# Patient Record
Sex: Female | Born: 1985 | Race: Black or African American | Hispanic: No | Marital: Single | Smoking: Current every day smoker
Health system: Southern US, Community
[De-identification: ages and names within clinical notes are randomized; demographics above are authoritative.]

## PROBLEM LIST (undated history)

## (undated) HISTORY — PX: BREAST BIOPSY: SHX20

---

## 2007-08-10 ENCOUNTER — Inpatient Hospital Stay (HOSPITAL_COMMUNITY): Admission: AD | Admit: 2007-08-10 | Discharge: 2007-08-10 | Payer: Self-pay | Admitting: Obstetrics and Gynecology

## 2007-08-10 ENCOUNTER — Ambulatory Visit: Payer: Self-pay | Admitting: Obstetrics and Gynecology

## 2007-08-14 ENCOUNTER — Inpatient Hospital Stay (HOSPITAL_COMMUNITY): Admission: AD | Admit: 2007-08-14 | Discharge: 2007-08-14 | Payer: Self-pay | Admitting: Obstetrics and Gynecology

## 2007-08-14 ENCOUNTER — Encounter: Payer: Self-pay | Admitting: *Deleted

## 2007-08-14 ENCOUNTER — Ambulatory Visit (HOSPITAL_COMMUNITY): Admission: RE | Admit: 2007-08-14 | Discharge: 2007-08-14 | Payer: Self-pay | Admitting: Obstetrics and Gynecology

## 2007-08-16 ENCOUNTER — Ambulatory Visit: Payer: Self-pay | Admitting: *Deleted

## 2007-08-21 ENCOUNTER — Ambulatory Visit: Payer: Self-pay | Admitting: Family Medicine

## 2007-08-24 ENCOUNTER — Ambulatory Visit: Payer: Self-pay | Admitting: Gynecology

## 2007-08-31 ENCOUNTER — Ambulatory Visit (HOSPITAL_COMMUNITY): Admission: RE | Admit: 2007-08-31 | Discharge: 2007-08-31 | Payer: Self-pay | Admitting: Family Medicine

## 2007-09-07 ENCOUNTER — Ambulatory Visit: Payer: Self-pay | Admitting: Obstetrics and Gynecology

## 2007-09-11 ENCOUNTER — Ambulatory Visit: Payer: Self-pay | Admitting: Family Medicine

## 2007-09-14 ENCOUNTER — Ambulatory Visit (HOSPITAL_COMMUNITY): Admission: RE | Admit: 2007-09-14 | Discharge: 2007-09-14 | Payer: Self-pay | Admitting: Family Medicine

## 2007-09-18 ENCOUNTER — Inpatient Hospital Stay (HOSPITAL_COMMUNITY): Admission: RE | Admit: 2007-09-18 | Discharge: 2007-09-23 | Payer: Self-pay | Admitting: Family Medicine

## 2007-09-18 ENCOUNTER — Ambulatory Visit: Payer: Self-pay | Admitting: Obstetrics & Gynecology

## 2007-09-18 ENCOUNTER — Ambulatory Visit: Payer: Self-pay | Admitting: Obstetrics and Gynecology

## 2007-09-19 ENCOUNTER — Encounter: Payer: Self-pay | Admitting: *Deleted

## 2007-09-20 ENCOUNTER — Encounter: Payer: Self-pay | Admitting: *Deleted

## 2007-09-21 ENCOUNTER — Encounter: Payer: Self-pay | Admitting: Obstetrics and Gynecology

## 2007-09-26 ENCOUNTER — Ambulatory Visit: Payer: Self-pay | Admitting: Family Medicine

## 2008-01-30 IMAGING — US US OB FOLLOW-UP
1 series · 14 of 25 positions shown · non-contrast
Comparison: none

OBSTETRICAL ULTRASOUND:
 This ultrasound was performed in The [HOSPITAL], and the [facility] report will be stored to [REDACTED] PACS.

[Series 1: us ob follow-up · 14 of 25 slices shown]
[im 1/25]
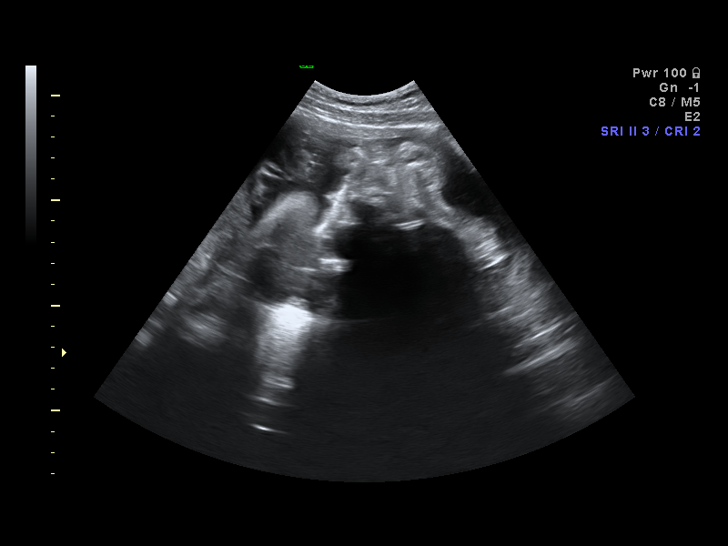
[im 3/25]
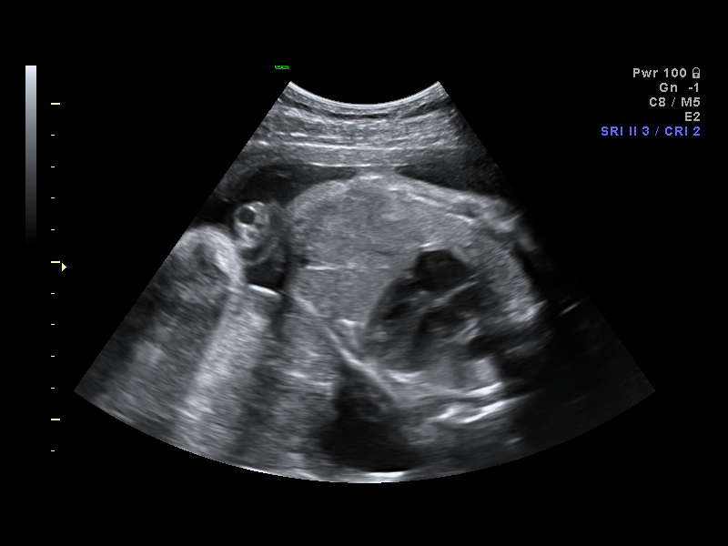
[im 5/25]
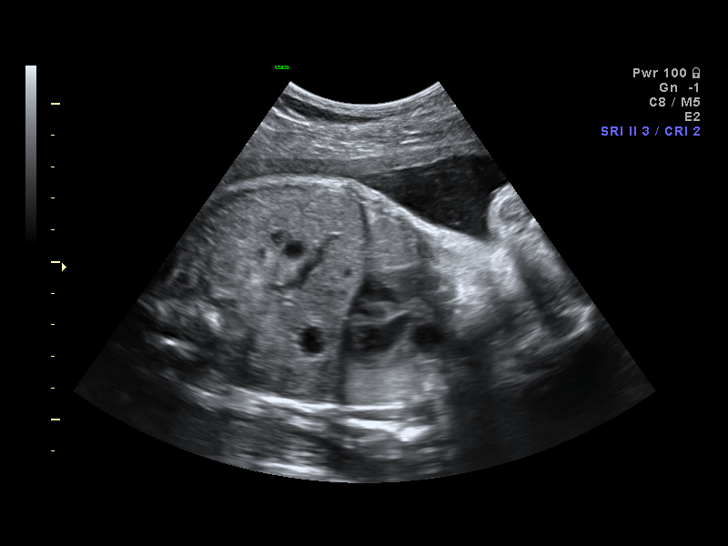
[im 7/25]
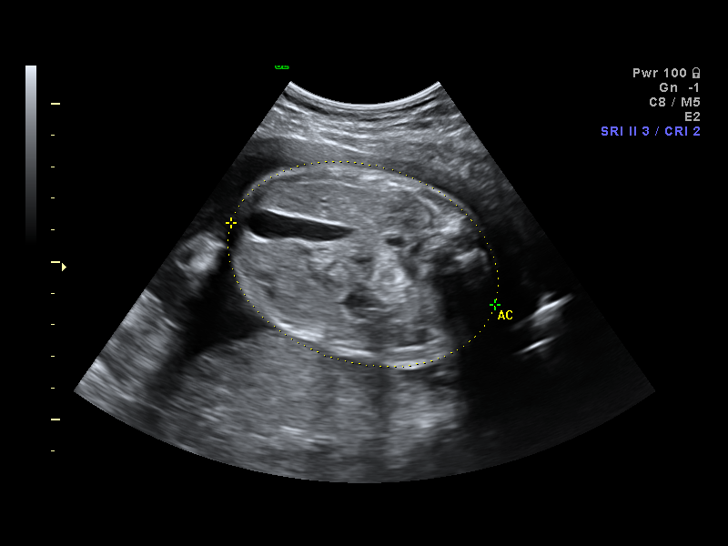
[im 9/25]
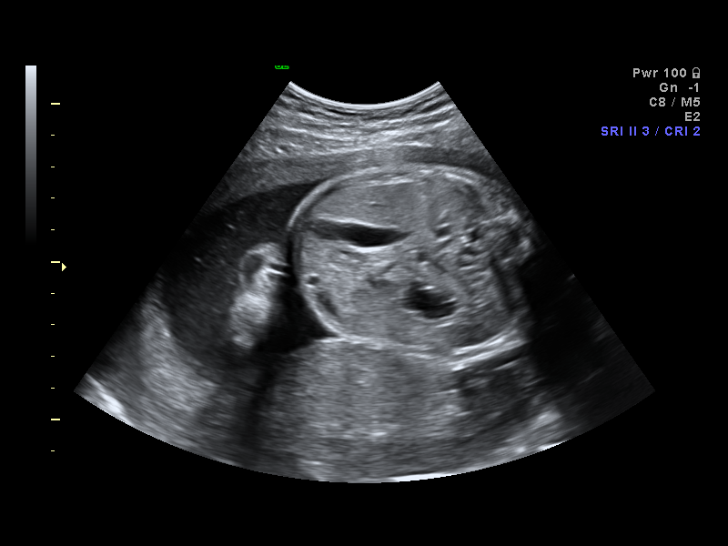
[im 10/25]
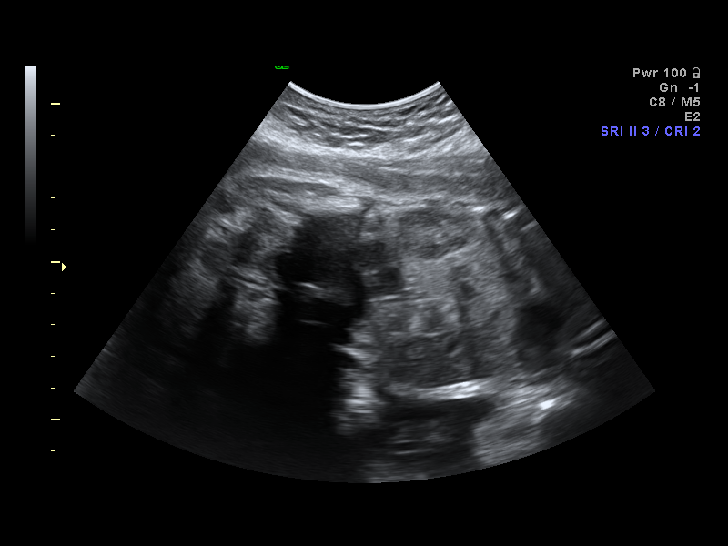
[im 12/25]
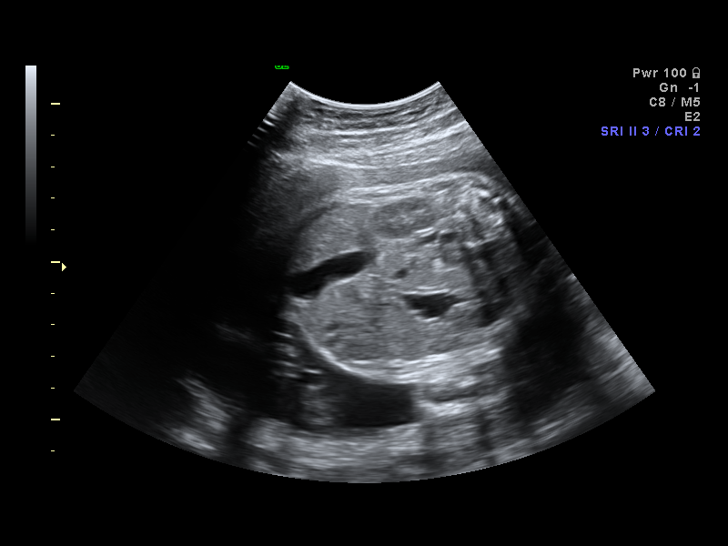
[im 14/25]
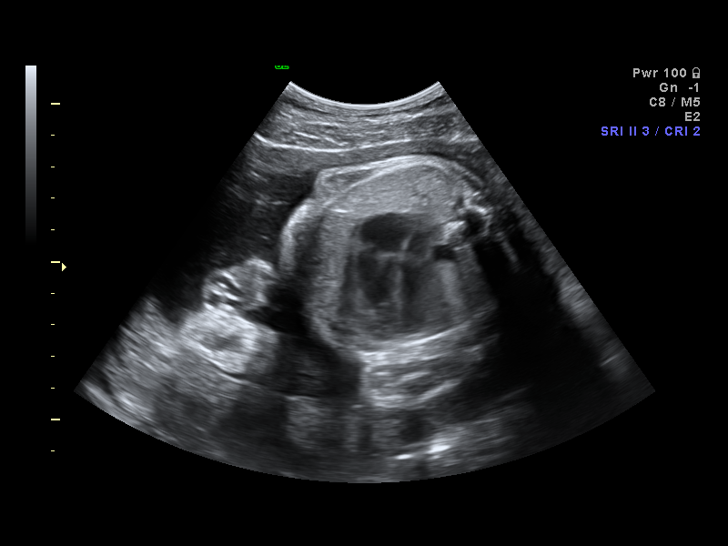
[im 16/25]
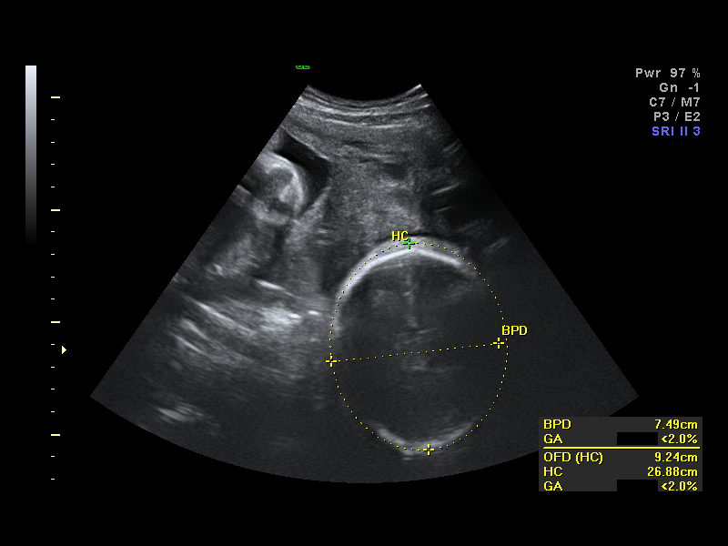
[im 17/25]
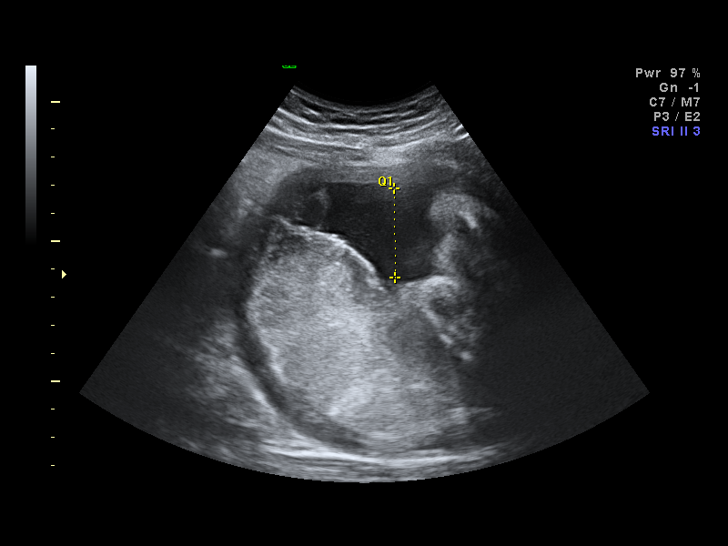
[im 19/25]
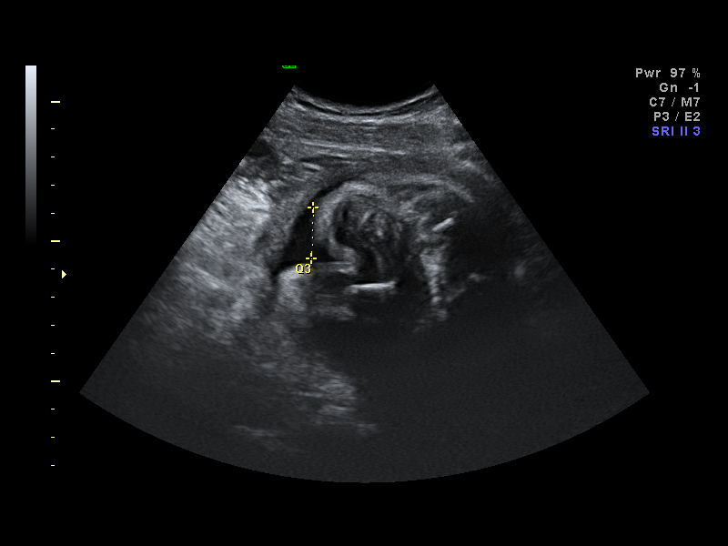
[im 21/25]
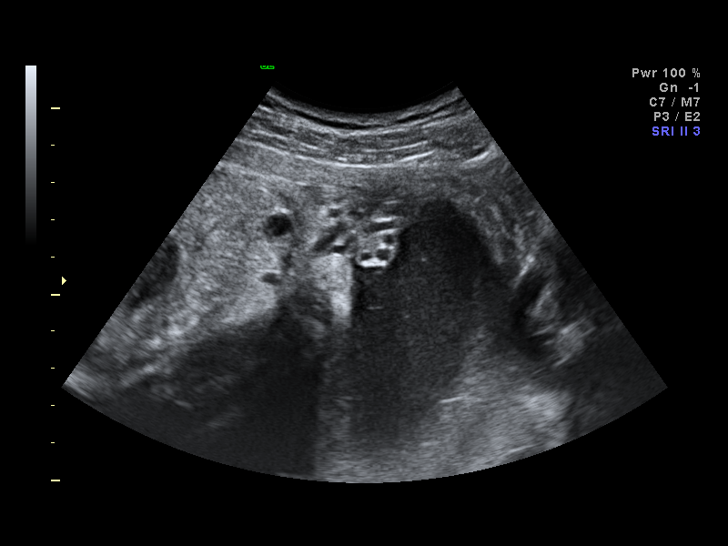
[im 23/25]
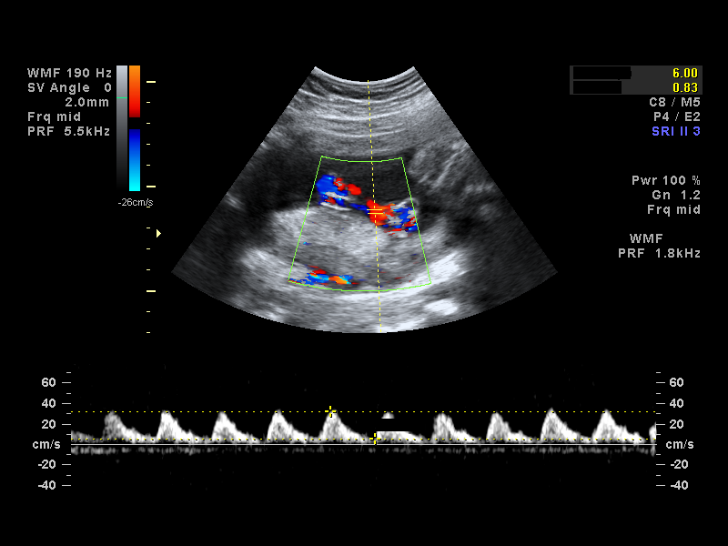
[im 25/25]
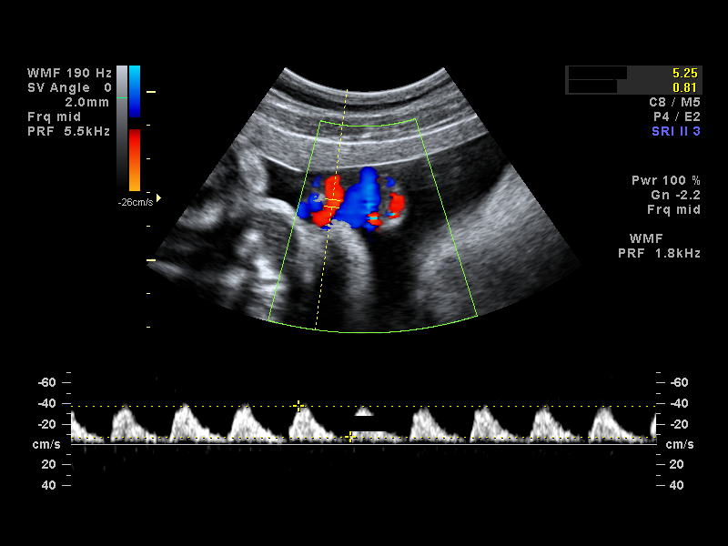

[14 of 25 positions shown; findings below may reference images not displayed]

IMPRESSION: The [facility] report has also been faxed to the ordering physician.

## 2010-12-27 ENCOUNTER — Encounter: Payer: Self-pay | Admitting: *Deleted

## 2010-12-28 ENCOUNTER — Encounter: Payer: Self-pay | Admitting: *Deleted

## 2011-04-20 NOTE — Op Note (Signed)
NAME:  Hamilton, Stacey              ACCOUNT NO.:  [id number]   MEDICAL RECORD NO.:  [id number]          PATIENT TYPE:  INP   LOCATION:                                FACILITY:  [facility]   PHYSICIAN:  John V. Ferguson, M.D. DATE OF BIRTH:  07/04/1986   DATE OF PROCEDURE:  DATE OF DISCHARGE:  09/26/2007                               OPERATIVE REPORT   PREOP DIAGNOSIS:  Pregnancy [redacted] weeks gestation, nonreassuring fetal  status.   POSTOP DIAGNOSIS:  Pregnancy [redacted] weeks gestation, nonreassuring fetal  status, delivered.   PROCEDURE:  Primary low transverse cervical cesarean section.   SURGEON:  1. Ferguson.  2. Simons.   ANESTHESIA:  Spinal.   COMPLICATIONS:  None.   EBL:  400 mL.   INDICATIONS:  A 25-year-old female being followed with uncertain fetal  status, noted to have worsening of the nonreassuring variable  decelerations.  Beat-to-beat variability remained present.  There was no  active labor but decelerations were spontaneously occurring.   DETAILS OF PROCEDURE:  The patient was taken to the operating room,  prepped and draped for lower abdominal surgery with a Foley catheter in  place.  A Pfannenstiel type incision was performed with nice development  of tissue planes and blunt entry of the peritoneal cavity.  A bladder  flap was developed on the low uterine segment, extended laterally using  blunt dissection and a transverse nick made in the lower uterine  segment, extended laterally and upward in order to give access to the  baby.  The amniotic fluid was clear with some vernix present.  The fetal  vertex was rotated into the incision and delivered with fundal pressure  with ease.  The infant was placed in the care of Dr. Dimaguila, see her  notes for details.  Apgars of 8/8 were assigned, weight was 13__ grams.  Placenta was noted to be quite small.  The uterus itself was actually  amazingly small.  The cord blood samples were obtained including a blood  gas which showed  a pH of 7.28 on arterial sample.  This was obtained  close to the placenta due to the small size of the cord and difficulty  obtaining the sample.  Venous blood samples were obtained for routine  labs on the infant, placenta went to Pathology.   The uterus was emptied.  The uterus closed using single layer running  locking #0 chromic and with good hemostasis.  Bladder flap was loosely  reapproximated with a single stitch of #2-0 chromic.  The peritoneal  cavity was irrigated, then the anterior peritoneum closed with  continuous running #2-0 chromic.  The rectus muscles were  pulled together with a single suture of #0 Vicryl.  The fascia was  closed in a continuous running fashion using #0 Vicryl.  The subcu  tissues were reapproximated using interrupted #2-0 chromic x3 sutures.  Staple closure of the skin completed the procedure with good tissue edge  approximation.  Sponge and needle counts correct.      John V. Ferguson, M.D.  Electronically Signed     JVF/MEDQ  D:    09/21/2007  T:  09/21/2007  Job:  [id number] 
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
## 2011-04-20 NOTE — Op Note (Signed)
[name], [name]              ACCOUNT NO.:  [id number]   MEDICAL RECORD NO.:  [id number]          PATIENT TYPE:  INP   LOCATION:                                [facility]ILITY:  [facility]   PHYSICIAN:  Tilda Burrow, M.D. DATE OF BIRTH:  [birth year]   DATE OF PROCEDURE:  DATE OF DISCHARGE:  09/26/2007                               OPERATIVE REPORT   PREOP DIAGNOSIS:  Pregnancy [redacted] weeks gestation, nonreassuring fetal  status.   POSTOP DIAGNOSIS:  Pregnancy [redacted] weeks gestation, nonreassuring fetal  status, delivered.   PROCEDURE:  Primary low transverse cervical cesarean section.   SURGEON:  1. Ferguson.  2. Darrol Angel.   ANESTHESIA:  Spinal.   COMPLICATIONS:  None.   EBL:  400 mL.   INDICATIONS:  A 25 year old female being followed with uncertain fetal  status, noted to have worsening of the nonreassuring variable  decelerations.  Beat-to-beat variability remained present.  There was no  active labor but decelerations were spontaneously occurring.   DETAILS OF PROCEDURE:  The patient was taken to the operating room,  prepped and draped for lower abdominal surgery with a Foley catheter in  place.  A Pfannenstiel type incision was performed with nice development  of tissue planes and blunt entry of the peritoneal cavity.  A bladder  flap was developed on the low uterine segment, extended laterally using  blunt dissection and a transverse nick made in the lower uterine  segment, extended laterally and upward in order to give access to the  baby.  The amniotic fluid was clear with some vernix present.  The fetal  vertex was rotated into the incision and delivered with fundal pressure  with ease.  The infant was placed in the care of Dr. Francine Graven, see her  notes for details.  Apgars of 8/8 were assigned, weight was 13__ grams.  Placenta was noted to be quite small.  The uterus itself was actually  amazingly small.  The cord blood samples were obtained including a blood  gas which showed  a pH of 7.28 on arterial sample.  This was obtained  close to the placenta due to the small size of the cord and difficulty  obtaining the sample.  Venous blood samples were obtained for routine  labs on the infant, placenta went to Pathology.   The uterus was emptied.  The uterus closed using single layer running  locking #0 chromic and with good hemostasis.  Bladder flap was loosely  reapproximated with a single stitch of #2-0 chromic.  The peritoneal  cavity was irrigated, then the anterior peritoneum closed with  continuous running #2-0 chromic.  The rectus muscles were  pulled together with a single suture of #0 Vicryl.  The fascia was  closed in a continuous running fashion using #0 Vicryl.  The subcu  tissues were reapproximated using interrupted #2-0 chromic x3 sutures.  Staple closure of the skin completed the procedure with good tissue edge  approximation.  Sponge and needle counts correct.      Tilda Burrow, M.D.  Electronically Signed     JVF/MEDQ  D:  09/21/2007  T:  09/21/2007  Job:  [id number]

## 2011-09-15 LAB — CBC: WBC: 8.8

## 2011-09-16 LAB — CBC
Hemoglobin: 12.7
MCV: 89.1
RBC: 4.22

## 2011-09-16 LAB — RAPID URINE DRUG SCREEN, HOSP PERFORMED
Amphetamines: NOT DETECTED
Barbiturates: NOT DETECTED
Benzodiazepines: NOT DETECTED
Cocaine: NOT DETECTED
Opiates: NOT DETECTED
Tetrahydrocannabinol: NOT DETECTED

## 2011-09-16 LAB — POCT URINALYSIS DIP (DEVICE)
Bilirubin Urine: NEGATIVE
Bilirubin Urine: NEGATIVE
Glucose, UA: NEGATIVE
Glucose, UA: NEGATIVE
Hgb urine dipstick: NEGATIVE
Operator id: 120861
Protein, ur: NEGATIVE
Specific Gravity, Urine: 1.015
Urobilinogen, UA: 0.2
Urobilinogen, UA: 1

## 2011-09-16 LAB — STREP B DNA PROBE: Strep Group B Ag: NEGATIVE

## 2011-09-16 LAB — RPR: RPR Ser Ql: NONREACTIVE

## 2011-09-17 LAB — CBC
HCT: 37.2
Platelets: 278
WBC: 8.2

## 2011-09-17 LAB — URINALYSIS, ROUTINE W REFLEX MICROSCOPIC
Ketones, ur: 15 — AB
Nitrite: NEGATIVE
Specific Gravity, Urine: 1.02
Urobilinogen, UA: 0.2

## 2011-09-17 LAB — DIFFERENTIAL
Eosinophils Relative: 3
Lymphocytes Relative: 28
Lymphs Abs: 2.3
Neutro Abs: 5.1

## 2011-09-17 LAB — TYPE AND SCREEN
ABO/RH(D): B POS
Antibody Screen: NEGATIVE

## 2011-09-17 LAB — RUBELLA SCREEN: Rubella: 68.9 — ABNORMAL HIGH

## 2011-09-17 LAB — RAPID URINE DRUG SCREEN, HOSP PERFORMED
Cocaine: NOT DETECTED
Tetrahydrocannabinol: POSITIVE — AB

## 2011-09-17 LAB — HEPATITIS B SURFACE ANTIGEN: Hepatitis B Surface Ag: NEGATIVE

## 2011-09-17 LAB — GC/CHLAMYDIA PROBE AMP, GENITAL
Chlamydia, DNA Probe: NEGATIVE
GC Probe Amp, Genital: NEGATIVE

## 2011-09-17 LAB — WET PREP, GENITAL: Clue Cells Wet Prep HPF POC: NONE SEEN

## 2011-09-17 LAB — ABO/RH: ABO/RH(D): B POS

## 2011-09-17 LAB — RAPID HIV SCREEN (WH-MAU): Rapid HIV Screen: NONREACTIVE

## 2011-09-17 LAB — SICKLE CELL SCREEN: Sickle Cell Screen: NEGATIVE

## 2011-09-17 LAB — RPR: RPR Ser Ql: NONREACTIVE

## 2012-01-14 ENCOUNTER — Emergency Department (HOSPITAL_COMMUNITY)
Admission: EM | Admit: 2012-01-14 | Discharge: 2012-01-14 | Disposition: A | Discharge status: Home / Self Care | Payer: No Typology Code available for payment source | Attending: Emergency Medicine | Admitting: Emergency Medicine

## 2012-01-14 ENCOUNTER — Encounter (HOSPITAL_COMMUNITY): Payer: Self-pay | Admitting: Emergency Medicine

## 2012-01-14 DIAGNOSIS — S39012A Strain of muscle, fascia and tendon of lower back, initial encounter: Secondary | ICD-10-CM

## 2012-01-14 DIAGNOSIS — S339XXA Sprain of unspecified parts of lumbar spine and pelvis, initial encounter: Secondary | ICD-10-CM | POA: Insufficient documentation

## 2012-01-14 DIAGNOSIS — M545 Low back pain, unspecified: Secondary | ICD-10-CM | POA: Insufficient documentation

## 2012-01-14 MED ORDER — CYCLOBENZAPRINE HCL 5 MG PO TABS: 5.0000 mg | ORAL_TABLET | Freq: Three times a day (TID) | ORAL | Status: AC | PRN

## 2012-01-14 MED ORDER — IBUPROFEN 100 MG/5ML PO SUSP
600.0000 mg | Freq: Once | ORAL | Status: AC
  Administered 2012-01-14: 600 mg via ORAL
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
## 2014-06-03 ENCOUNTER — Emergency Department (HOSPITAL_COMMUNITY)
Admission: EM | Admit: 2014-06-03 | Discharge: 2014-06-03 | Disposition: A | Discharge status: Home / Self Care | Payer: No Typology Code available for payment source | Attending: Emergency Medicine | Admitting: Emergency Medicine

## 2014-06-03 ENCOUNTER — Emergency Department (HOSPITAL_COMMUNITY): Payer: No Typology Code available for payment source

## 2014-06-03 ENCOUNTER — Encounter (HOSPITAL_COMMUNITY): Payer: Self-pay | Admitting: Emergency Medicine

## 2014-06-03 DIAGNOSIS — Y92410 Unspecified street and highway as the place of occurrence of the external cause: Secondary | ICD-10-CM | POA: Insufficient documentation

## 2014-06-03 DIAGNOSIS — S139XXA Sprain of joints and ligaments of unspecified parts of neck, initial encounter: Secondary | ICD-10-CM | POA: Insufficient documentation

## 2014-06-03 DIAGNOSIS — Z3202 Encounter for pregnancy test, result negative: Secondary | ICD-10-CM | POA: Insufficient documentation

## 2014-06-03 DIAGNOSIS — Y9389 Activity, other specified: Secondary | ICD-10-CM | POA: Insufficient documentation

## 2014-06-03 DIAGNOSIS — S161XXA Strain of muscle, fascia and tendon at neck level, initial encounter: Secondary | ICD-10-CM

## 2014-06-03 DIAGNOSIS — IMO0002 Reserved for concepts with insufficient information to code with codable children: Secondary | ICD-10-CM | POA: Insufficient documentation

## 2014-06-03 DIAGNOSIS — M791 Myalgia, unspecified site: Secondary | ICD-10-CM

## 2014-06-03 DIAGNOSIS — F17200 Nicotine dependence, unspecified, uncomplicated: Secondary | ICD-10-CM | POA: Insufficient documentation

## 2014-06-03 LAB — POC URINE PREG, ED: PREG TEST UR: NEGATIVE

## 2014-06-03 MED ORDER — CYCLOBENZAPRINE HCL 10 MG PO TABS: 10.0000 mg | ORAL_TABLET | Freq: Two times a day (BID) | ORAL | Status: AC | PRN

## 2014-06-03 MED ORDER — IBUPROFEN 600 MG PO TABS: 600.0000 mg | ORAL_TABLET | Freq: Four times a day (QID) | ORAL | Status: AC | PRN
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
## 2014-06-03 NOTE — Discharge Instructions (Signed)
Please call your doctor for a followup appointment within 24-48 hours. When you talk to your doctor please let them know that you were seen in the emergency department and have them acquire all of your records so that they can discuss the findings with you and formulate a treatment plan to fully care for your new and ongoing problems. Please call and set-[facility] an [facility] with [facility]  Please call and set-up an appointment with Orthopedics to be seen and re-assessed Please take medications as prescribed - while on muscle relaxers this can lead to drowsiness, please do not take while driving or operating any heavy machinery Please continue to monitor symptoms closely and if symptoms are to worsen or change (fever greater than 101, chill, chest pain, shortness of breath, difficulty breathing, numbness, tingling, weakness, headache, dizziness, visual changes, sudden of loss vision, nausea, vomiting, stomach pain, neck pain, neck stiffness) please report back to the ED immediately    Cervical Strain and Sprain (Whiplash) with Rehab Cervical strain and sprains are injuries that commonly occur with "whiplash" injuries. Whiplash occurs when the neck is forcefully whipped backward or forward, such as during a motor vehicle accident. The muscles, ligaments, tendons, discs and nerves of the neck are susceptible to injury when this occurs. SYMPTOMS   Pain or stiffness in the front and/or back of neck  Symptoms may present immediately or up to 24 hours after injury.  Dizziness, headache, nausea and vomiting.  Muscle spasm with soreness and stiffness in the neck.  Tenderness and swelling at the injury site. CAUSES  Whiplash injuries often occur during contact sports or motor vehicle accidents.  RISK INCREASES WITH:  Osteoarthritis of the spine.  Situations that make head or neck accidents or trauma more likely.  High-risk sports (football, rugby, wrestling, hockey, auto racing,  gymnastics, diving, contact karate or boxing).  Poor strength and flexibility of the neck.  Previous neck injury.  Poor tackling technique.  Improperly fitted or padded equipment. PREVENTION  Learn and use proper technique (avoid tackling with the head, spearing and head-butting; use proper falling techniques to avoid landing on the head).  Warm up and stretch properly before activity.  Maintain physical fitness:  Strength, flexibility and endurance.  Cardiovascular fitness.  Wear properly fitted and padded protective equipment, such as padded soft collars, for participation in contact sports. PROGNOSIS  Recovery for cervical strain and sprain injuries is dependent on the extent of the injury. These injuries are usually curable in 1 week to 3 months with appropriate treatment.  RELATED COMPLICATIONS   Temporary numbness and weakness may occur if the nerve roots are damaged, and this may persist until the nerve has completely healed.  Chronic pain due to frequent recurrence of symptoms.  Prolonged healing, especially if activity is resumed too soon (before complete recovery). TREATMENT  Treatment initially involves the use of ice and medication to help reduce pain and inflammation. It is also important to perform strengthening and stretching exercises and modify activities that worsen symptoms so the injury does not get worse. These exercises may be performed at home or with a therapist. For patients who experience severe symptoms, a soft padded collar may be recommended to be worn around the neck.  Improving your posture may help reduce symptoms. Posture improvement includes pulling your chin and abdomen in while sitting or standing. If you are sitting, sit in a firm chair with your buttocks against the back of the chair. While sleeping, try replacing your pillow with  a small towel rolled to 2 inches in diameter, or use a cervical pillow or soft cervical collar. Poor sleeping  positions delay healing.  For patients with nerve root damage, which causes numbness or weakness, the use of a cervical traction apparatus may be recommended. Surgery is rarely necessary for these injuries. However, cervical strain and sprains that are present at birth (congenital) may require surgery. MEDICATION   If pain medication is necessary, nonsteroidal anti-inflammatory medications, such as aspirin and ibuprofen, or other minor pain relievers, such as acetaminophen, are often recommended.  Do not take pain medication for 7 days before surgery.  Prescription pain relievers may be given if deemed necessary by your caregiver. Use only as directed and only as much as you need. HEAT AND COLD:   Cold treatment (icing) relieves pain and reduces inflammation. Cold treatment should be applied for 10 to 15 minutes every 2 to 3 hours for inflammation and pain and immediately after any activity that aggravates your symptoms. Use ice packs or an ice massage.  Heat treatment may be used prior to performing the stretching and strengthening activities prescribed by your caregiver, physical therapist, or athletic trainer. Use a heat pack or a warm soak. SEEK MEDICAL CARE IF:   Symptoms get worse or do not improve in 2 weeks despite treatment.  New, unexplained symptoms develop (drugs used in treatment may produce side effects). EXERCISES RANGE OF MOTION (ROM) AND STRETCHING EXERCISES - Cervical Strain and Sprain These exercises may help you when beginning to rehabilitate your injury. In order to successfully resolve your symptoms, you must improve your posture. These exercises are designed to help reduce the forward-head and rounded-shoulder posture which contributes to this condition. Your symptoms may resolve with or without further involvement from your physician, physical therapist or athletic trainer. While completing these exercises, remember:   Restoring tissue flexibility helps normal motion to  return to the joints. This allows healthier, less painful movement and activity.  An effective stretch should be held for at least 20 seconds, although you may need to begin with shorter hold times for comfort.  A stretch should never be painful. You should only feel a gentle lengthening or release in the stretched tissue. STRETCH- Axial Extensors  Lie on your back on the floor. You may bend your knees for comfort. Place a rolled up hand towel or dish towel, about 2 inches in diameter, under the part of your head that makes contact with the floor.  Gently tuck your chin, as if trying to make a "double chin," until you feel a gentle stretch at the base of your head.  Hold __________ seconds. Repeat __________ times. Complete this exercise __________ times per day.  STRETECH - Axial Extension   Stand or sit on a firm surface. Assume a good posture: chest up, shoulders drawn back, abdominal muscles slightly tense, knees unlocked (if standing) and feet hip width apart.  Slowly retract your chin so your head slides back and your chin slightly lowers.Continue to look straight ahead.  You should feel a gentle stretch in the back of your head. Be certain not to feel an aggressive stretch since this can cause headaches later.  Hold for __________ seconds. Repeat __________ times. Complete this exercise __________ times per day. STRETCH - Cervical Side Bend   Stand or sit on a firm surface. Assume a good posture: chest up, shoulders drawn back, abdominal muscles slightly tense, knees unlocked (if standing) and feet hip width apart.  Without letting your  nose or shoulders move, slowly tip your right / left ear to your shoulder until your feel a gentle stretch in the muscles on the opposite side of your neck.  Hold __________ seconds. Repeat __________ times. Complete this exercise __________ times per day. STRETCH - Cervical Rotators   Stand or sit on a firm surface. Assume a good posture:  chest up, shoulders drawn back, abdominal muscles slightly tense, knees unlocked (if standing) and feet hip width apart.  Keeping your eyes level with the ground, slowly turn your head until you feel a gentle stretch along the back and opposite side of your neck.  Hold __________ seconds. Repeat __________ times. Complete this exercise __________ times per day. RANGE OF MOTION - Neck Circles   Stand or sit on a firm surface. Assume a good posture: chest up, shoulders drawn back, abdominal muscles slightly tense, knees unlocked (if standing) and feet hip width apart.  Gently roll your head down and around from the back of one shoulder to the back of the other. The motion should never be forced or painful.  Repeat the motion 10-20 times, or until you feel the neck muscles relax and loosen. Repeat __________ times. Complete the exercise __________ times per day. STRENGTHENING EXERCISES - Cervical Strain and Sprain These exercises may help you when beginning to rehabilitate your injury. They may resolve your symptoms with or without further involvement from your physician, physical therapist or athletic trainer. While completing these exercises, remember:   Muscles can gain both the endurance and the strength needed for everyday activities through controlled exercises.  Complete these exercises as instructed by your physician, physical therapist or athletic trainer. Progress the resistance and repetitions only as guided.  You may experience muscle soreness or fatigue, but the pain or discomfort you are trying to eliminate should never worsen during these exercises. If this pain does worsen, stop and make certain you are following the directions exactly. If the pain is still present after adjustments, discontinue the exercise until you can discuss the trouble with your clinician. STRENGTH - Cervical Flexors, Isometric  Face a wall, standing about 6 inches away. Place a small pillow, a ball about  6-8 inches in diameter, or a folded towel between your forehead and the wall.  Slightly tuck your chin and gently push your forehead into the soft object. Push only with mild to moderate intensity, building up tension gradually. Keep your jaw and forehead relaxed.  Hold 10 to 20 seconds. Keep your breathing relaxed.  Release the tension slowly. Relax your neck muscles completely before you start the next repetition. Repeat __________ times. Complete this exercise __________ times per day. STRENGTH- Cervical Lateral Flexors, Isometric   Stand about 6 inches away from a wall. Place a small pillow, a ball about 6-8 inches in diameter, or a folded towel between the side of your head and the wall.  Slightly tuck your chin and gently tilt your head into the soft object. Push only with mild to moderate intensity, building up tension gradually. Keep your jaw and forehead relaxed.  Hold 10 to 20 seconds. Keep your breathing relaxed.  Release the tension slowly. Relax your neck muscles completely before you start the next repetition. Repeat __________ times. Complete this exercise __________ times per day. STRENGTH - Cervical Extensors, Isometric   Stand about 6 inches away from a wall. Place a small pillow, a ball about 6-8 inches in diameter, or a folded towel between the back of your head and the wall.  Slightly tuck your chin and gently tilt your head back into the soft object. Push only with mild to moderate intensity, building up tension gradually. Keep your jaw and forehead relaxed.  Hold 10 to 20 seconds. Keep your breathing relaxed.  Release the tension slowly. Relax your neck muscles completely before you start the next repetition. Repeat __________ times. Complete this exercise __________ times per day. POSTURE AND BODY MECHANICS CONSIDERATIONS - Cervical Strain and Sprain Keeping correct posture when sitting, standing or completing your activities will reduce the stress put on  different body tissues, allowing injured tissues a chance to heal and limiting painful experiences. The following are general guidelines for improved posture. Your physician or physical therapist will provide you with any instructions specific to your needs. While reading these guidelines, remember:  The exercises prescribed by your provider will help you have the flexibility and strength to maintain correct postures.  The correct posture provides the optimal environment for your joints to work. All of your joints have less wear and tear when properly supported by a spine with good posture. This means you will experience a healthier, less painful body.  Correct posture must be practiced with all of your activities, especially prolonged sitting and standing. Correct posture is as important when doing repetitive low-stress activities (typing) as it is when doing a single heavy-load activity (lifting). PROLONGED STANDING WHILE SLIGHTLY LEANING FORWARD When completing a task that requires you to lean forward while standing in one place for a long time, place either foot up on a stationary 2-4 inch high object to help maintain the best posture. When both feet are on the ground, the low back tends to lose its slight inward curve. If this curve flattens (or becomes too large), then the back and your other joints will experience too much stress, fatigue more quickly and can cause pain.  RESTING POSITIONS Consider which positions are most painful for you when choosing a resting position. If you have pain with flexion-based activities (sitting, bending, stooping, squatting), choose a position that allows you to rest in a less flexed posture. You would want to avoid curling into a fetal position on your side. If your pain worsens with extension-based activities (prolonged standing, working overhead), avoid resting in an extended position such as sleeping on your stomach. Most people will find more comfort when they  rest with their spine in a more neutral position, neither too rounded nor too arched. Lying on a non-sagging bed on your side with a pillow between your knees, or on your back with a pillow under your knees will often provide some relief. Keep in mind, being in any one position for a prolonged period of time, no matter how correct your posture, can still lead to stiffness. WALKING Walk with an upright posture. Your ears, shoulders and hips should all line-up. OFFICE WORK When working at a desk, create an environment that supports good, upright posture. Without extra support, muscles fatigue and lead to excessive strain on joints and other tissues. CHAIR:  A chair should be able to slide under your desk when your back makes contact with the back of the chair. This allows you to work closely.  The chair's height should allow your eyes to be level with the upper part of your monitor and your hands to be slightly lower than your elbows.  Body position:  Your feet should make contact with the floor. If this is not possible, use a foot rest.  Keep your ears over  your shoulders. This will reduce stress on your neck and low back. Document Released: 11/22/2005 Document Revised: 03/19/2013 Document Reviewed: 03/06/2009 [facility] Patient Information 2015 [place], [place]. This information is not intended to replace advice given to you by your health care provider. Make sure you discuss any questions you have with your health care provider. Back Pain, Adult Back pain is very common. The pain often gets better over time. The cause of back pain is usually not dangerous. Most people can learn to manage their back pain on their own.  HOME CARE   Stay active. Start with short walks on flat ground if you can. Try to walk farther each day.  Do not sit, drive, or stand in one place for more than 30 minutes. Do not stay in bed.  Do not avoid exercise or work. Activity can help your back heal faster.  Be careful  when you bend or lift an object. Bend at your knees, keep the object close to you, and do not twist.  Sleep on a firm mattress. Lie on your side, and bend your knees. If you lie on your back, put a pillow under your knees.  Only take medicines as told by your doctor.  Put ice on the injured area.  Put ice in a plastic bag.  Place a towel between your skin and the bag.  Leave the ice on for 15-20 minutes, 03-04 times a day for the first 2 to 3 days. After that, you can switch between ice and heat packs.  Ask your doctor about back exercises or massage.  Avoid feeling anxious or stressed. Find good ways to deal with stress, such as exercise. GET HELP RIGHT AWAY IF:   Your pain does not go away with rest or medicine.  Your pain does not go away in 1 week.  You have new problems.  You do not feel well.  The pain spreads into your legs.  You cannot control when you poop (bowel movement) or pee (urinate).  Your arms or legs feel weak or lose feeling (numbness).  You feel sick to your stomach (nauseous) or throw up (vomit).  You have belly (abdominal) pain.  You feel like you may pass out (faint). MAKE SURE YOU:   Understand these instructions.  Will watch your condition.  Will get help right away if you are not doing well or get worse. Document Released: 05/10/2008 Document Revised: 02/14/2012 Document Reviewed: 04/12/2011 [facility] Patient Information 2015 [place], [place]. This information is not intended to replace advice given to you by your health care provider. Make sure you discuss any questions you have with your health care provider.   Emergency Department Resource Guide 1) Find a Doctor and Pay Out of Pocket Although you won't have to find out who is covered by your insurance plan, it is a good idea to ask around and get recommendations. You will then need to call the office and see if the doctor you have chosen will accept you as a new patient and what types of  options they offer for patients who are self-pay. Some doctors offer discounts or will set up payment plans for their patients who do not have insurance, but you will need to ask so you aren't surprised when you get to your appointment.  2) Contact Your Local Health Department Not all health departments have doctors that can see patients for sick visits, but many do, so it is worth a call to see if yours does. If you  don't know where your local health department is, you can check in your phone book. The CDC also has a tool to help you locate your state's health department, and many state websites also have listings of all of their local health departments.  3) Find a Walk-in Clinic If your illness is not likely to be very severe or complicated, you may want to try a walk in clinic. These are popping up all over the country in pharmacies, drugstores, and shopping centers. They're usually staffed by nurse practitioners or physician assistants that have been trained to treat common illnesses and complaints. They're usually fairly quick and inexpensive. However, if you have serious medical issues or chronic medical problems, these are probably not your best option.  No Primary Care Doctor: - Call [facility] at  [phone] - they can help you locate a primary care doctor that  accepts your insurance, provides certain services, etc. - Physician Referral Service- [phone]  Chronic Pain Problems: Organization         Address  Phone   Notes  [facility]  [phone] Patients need to be referred by their primary care doctor.   Medication Assistance: Organization         Address  Phone   Notes  [facility] [address] [place] [place], [place] [address] [phone] --Must be a resident of [facility] -- Must have NO insurance coverage whatsoever (no Medicaid/ Medicare, etc.) -- The pt. MUST have a primary care doctor that directs  their care regularly and follows them in the community   [facility]  [phone]   [employer]  [phone]    Agencies that provide inexpensive medical care: Organization         Address  Phone   Notes  [facility]  [phone]   [facility]    [phone]   [facility] [address] [place], [place] [address] [phone]   [facility] [address] [place] [phone]   [facility]    [phone]   [facility]    [phone]   [facility]  [address], [place] Phone:  [phone], Fax:  [phone] Hours of Operation:  9 am - 6 pm, M-F.  Also accepts Medicaid/Medicare and self-pay.  [facility]  [address], [place] Phone: [phone], Fax: [phone]. Hours of Operation:  8:30 am - 5:30 pm, M-F.  Also accepts Medicaid and self-pay.  [facility] [place] [address] [place] Phone: [phone]   [facility] [address] Natasha Bence [place], [place] [phone], Ext. [phone] Mondays & Thursdays: 7-9 AM.  First 15 patients are seen on a first come, first serve basis.    Medicaid-accepting [facility] Providers:  Organization         Address  Phone   Notes  [facility] [address], [place] [phone] Also accepts self-pay patients.  [facility] [address] Laurell Josephs [place], [place]  [phone]   [facility] [address], [place] [phone]   [facility] [address] [place] [phone]   Renaye Rakers [address], [place]   [phone] Only accepts [place] Access  Medicaid patients after they have their name applied to their card.   Self-Pay (no insurance) in [facility]:  Organization          Address  Phone   Notes  Sickle Cell Patients, [facility] [address] [place], [place] [phone]   [facility] [address] [place], [place] [phone]   [facility]  [address], [place] [phone]   [facility]/Dr. Osei-Bonsu  [address] [place] or [address], [place] [phone] Phone number for both [place] and [place] locations is the same.  [facility] [address], [place] [phone]   [facility] [address], [place] or [address] [phone]   [facility] [address] [place] [phone], phone; [phone], fax Sees patients 1st and 3rd Saturday of every month.  Must not qualify for public or private insurance (i.e. Medicaid, Medicare, [place] Health Choice, Veterans' Benefits)  Household income should be no more than 200% of the poverty level The clinic cannot treat you if you are pregnant or think you are pregnant  Sexually transmitted diseases are not treated at the clinic.    Dental Care: Organization         Address  Phone  Notes  [facility] [address] [place], [place] [phone] Accepts children up to age 59 who are enrolled in [place] or [place] Health Choice; pregnant women with a Medicaid card; and children who have applied for Medicaid or [place] Health Choice, but were declined, whose parents can pay a reduced fee at time of service.  [facility]  [address], [place] [phone] Accepts children up to age 18 who are enrolled in [place] or [place] Health Choice; pregnant women with a Medicaid card; and children who have applied for Medicaid or [place] Health Choice, but were declined, whose parents can pay a reduced fee at time of  service.  [facility]  [address] [place], [place] [phone] Patients are seen by appointment only. Walk-ins are not accepted. [facility] will see patients 34 years of age and older. Monday - Tuesday (8am-5pm) Most Wednesdays (8:30-5pm) $30 per visit, cash only  [facility]  [address], [facility] [phone] Patients are seen by appointment only. Walk-ins are not accepted. [facility] will see patients 43 years of age and older. One Wednesday Evening (Monthly: Volunteer Based).  $30 per visit, cash only  [employer]  [phone] for adults; Children under age 20, call Graduate Pediatric Dentistry at [phone]. Children aged 30-14, please call [phone] to request a pediatric application.  Dental services are provided in all areas of dental care including fillings, crowns and bridges, complete and partial dentures, implants, gum treatment, root canals, and extractions. Preventive care is also provided. Treatment is provided to both adults and children. Patients are selected via a lottery and there is often a waiting list.   [facility] [address] [place]  [phone] [email or web address]   [facility] [address] [place], [place] [phone], Ext. [phone] Second and Fourth Thursday of each month, opens at 6:30 AM; Clinic ends at 9 AM.  Patients are seen on a first-come first-served basis, and a limited number are seen during each clinic.   [facility]  [address] [place], [place] [phone]   Eligibility Requirements You must have lived in [place], [place], or [place] counties for at least the last three months.   You cannot be eligible for state or federal sponsored [place], including [employer], [place], or [employer].   You generally cannot be eligible for healthcare insurance through your employer.     How to apply: Eligibility screenings are held every Tuesday and Wednesday afternoon from 1:00 pm until 4:00 pm. You do not need an appointment for the interview!  [facility] [address] [place], [place] [phone]   [facility] Health Department  [phone]   [facility] Health Department  [phone]   [facility] Health Department  [phone]    Behavioral Health Resources in the Community: Intensive Outpatient Programs Organization         Address  Phone  Notes  [facility] [address], [place], [place] [phone]   [facility] [address] [place], [place] [phone]   [facility] [address] [place], [place]  [phone]   [facility] [address],  [place], [place] [phone] or [phone]   Substance Abuse Resources Organization         Address  Phone  Notes  [facility]  [phone]   [facility]  [phone]   [facility]  [phone]   Floydene Flock  [phone]   Residential & Outpatient Substance Abuse Program  [phone]   Psychological Services Organization         Address  Phone  Notes  [facility]  [phone]   [facility]  [phone]   [facility] Mental Health [address], [place] [phone] or [phone]    Mobile Crisis Teams Organization         Address  Phone  Notes  [facility], Mobile Crisis Care Unit  [phone]   [facility]  [address] [place], [place] [phone]   Doristine Locks [address] [place] [phone]    Self-Help/Support Groups Organization         Address  Phone             Notes  [facility] [place] - variety of support groups  336- I7437963 Call for more information  Narcotics Anonymous (NA), [facility] [address]  Dr, [employer] [place]de  2 meetings at this location   Statistician         Address  Phone  Notes  [facility] [address],    [place]  [phone]   [facility]  [address] [place] [address], [place], [place] [phone]   [facility] [address] [place], [place] [phone] Admissions: 8am-3pm M-F  [facility] [address],    [place], [place] [phone]   [facility] [address] [place]ling Manns [place], [place] [phone]   [facility] [address]  [place], [place] [phone]   [facility] Intensive Outpatient [address], [place], [place] [phone]   [facility] ([facility] [address] [place]  [place], [place] [phone] or [phone]   [facility] ([facility]) [address], [place], [place] [phone] Accepts Medicaid  [facility] [address]  [place] [phone] Substance Abuse/Addiction Treatment   [facility] Organization         Address  Phone  Notes  [facility]  [phone]   Angie Fava, PhD [address]vin Knack [place], [place]   [phone] or [phone]   [facility]   [address] [place], [place] [phone]   [facility] [address] [place], [place] [phone] Insurance/Medicaid/sponsorship through [facility] [address]                                    [place], [place] [phone] Therapy/tele-psych/case  [facility] [address], [place] [phone]    Dr. Lolly Mustache  [phone]   [facility]  [employer] [facility] Dept. 1) [address], [place] 2) [address] [place] 3)  [address], [place] [phone]  [phone]   [facility] Child Abuse  Hotline [phone] or [phone] (After Hours)

## 2014-06-04 NOTE — ED Provider Notes (Signed)
Medical screening examination/treatment/procedure(s) were performed by non-physician practitioner and as supervising physician I was immediately available for consultation/collaboration.   Megan E Docherty, MD 06/04/14 0013 
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
## 2016-10-27 ENCOUNTER — Emergency Department (HOSPITAL_COMMUNITY): Payer: No Typology Code available for payment source

## 2016-10-27 ENCOUNTER — Encounter (HOSPITAL_COMMUNITY): Payer: Self-pay

## 2016-10-27 ENCOUNTER — Emergency Department (HOSPITAL_COMMUNITY)
Admission: EM | Admit: 2016-10-27 | Discharge: 2016-10-27 | Disposition: A | Discharge status: Home / Self Care | Payer: No Typology Code available for payment source | Attending: Emergency Medicine | Admitting: Emergency Medicine

## 2016-10-27 DIAGNOSIS — Y939 Activity, unspecified: Secondary | ICD-10-CM | POA: Diagnosis not present

## 2016-10-27 DIAGNOSIS — Y999 Unspecified external cause status: Secondary | ICD-10-CM | POA: Insufficient documentation

## 2016-10-27 DIAGNOSIS — R0781 Pleurodynia: Secondary | ICD-10-CM | POA: Diagnosis not present

## 2016-10-27 DIAGNOSIS — F17210 Nicotine dependence, cigarettes, uncomplicated: Secondary | ICD-10-CM | POA: Diagnosis not present

## 2016-10-27 DIAGNOSIS — Y92410 Unspecified street and highway as the place of occurrence of the external cause: Secondary | ICD-10-CM | POA: Insufficient documentation

## 2016-10-27 DIAGNOSIS — M791 Myalgia: Secondary | ICD-10-CM | POA: Diagnosis not present

## 2016-10-27 DIAGNOSIS — M7918 Myalgia, other site: Secondary | ICD-10-CM

## 2016-10-27 MED ORDER — IBUPROFEN 200 MG PO TABS
600.0000 mg | ORAL_TABLET | Freq: Once | ORAL | Status: AC
  Administered 2016-10-27: 600 mg via ORAL
  Filled 2016-10-27: qty 3

## 2016-10-27 MED ORDER — METHOCARBAMOL 500 MG PO TABS: 500.0000 mg | ORAL_TABLET | Freq: Two times a day (BID) | ORAL | 0 refills | Status: AC
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
## 2017-12-08 ENCOUNTER — Encounter (HOSPITAL_COMMUNITY): Payer: Self-pay | Admitting: Emergency Medicine

## 2017-12-08 DIAGNOSIS — Z5321 Procedure and treatment not carried out due to patient leaving prior to being seen by health care provider: Secondary | ICD-10-CM | POA: Insufficient documentation

## 2017-12-08 DIAGNOSIS — M25561 Pain in right knee: Secondary | ICD-10-CM | POA: Insufficient documentation

## 2017-12-08 DIAGNOSIS — M25562 Pain in left knee: Secondary | ICD-10-CM | POA: Insufficient documentation
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
## 2017-12-09 ENCOUNTER — Emergency Department (HOSPITAL_COMMUNITY)
Admission: EM | Admit: 2017-12-09 | Discharge: 2017-12-09 | Disposition: A | Discharge status: Home / Self Care | Payer: Self-pay | Attending: Emergency Medicine | Admitting: Emergency Medicine

## 2017-12-09 NOTE — ED Notes (Signed)
Pt called for room assignment no answer 

## 2019-10-11 ENCOUNTER — Other Ambulatory Visit: Payer: Self-pay

## 2019-10-11 DIAGNOSIS — Z20822 Contact with and (suspected) exposure to covid-19: Secondary | ICD-10-CM

## 2019-10-13 LAB — NOVEL CORONAVIRUS, NAA: SARS-CoV-2, NAA: NOT DETECTED

## 2019-10-23 ENCOUNTER — Other Ambulatory Visit: Payer: Self-pay

## 2019-10-23 DIAGNOSIS — Z20822 Contact with and (suspected) exposure to covid-19: Secondary | ICD-10-CM

## 2019-10-24 LAB — NOVEL CORONAVIRUS, NAA: SARS-CoV-2, NAA: NOT DETECTED

## 2022-01-05 ENCOUNTER — Emergency Department (HOSPITAL_COMMUNITY)
Admission: EM | Admit: 2022-01-05 | Discharge: 2022-01-05 | Disposition: A | Discharge status: Home / Self Care | Payer: 59 | Attending: Emergency Medicine | Admitting: Emergency Medicine

## 2022-01-05 ENCOUNTER — Encounter (HOSPITAL_COMMUNITY): Payer: Self-pay | Admitting: Emergency Medicine

## 2022-01-05 ENCOUNTER — Other Ambulatory Visit: Payer: Self-pay

## 2022-01-05 DIAGNOSIS — H9201 Otalgia, right ear: Secondary | ICD-10-CM | POA: Diagnosis present

## 2022-01-05 DIAGNOSIS — J069 Acute upper respiratory infection, unspecified: Secondary | ICD-10-CM | POA: Insufficient documentation

## 2022-01-05 DIAGNOSIS — H6690 Otitis media, unspecified, unspecified ear: Secondary | ICD-10-CM

## 2022-01-05 DIAGNOSIS — H6691 Otitis media, unspecified, right ear: Secondary | ICD-10-CM | POA: Insufficient documentation

## 2022-01-05 MED ORDER — AZITHROMYCIN 250 MG PO TABS: 250.0000 mg | ORAL_TABLET | Freq: Every day | ORAL | 0 refills | Status: AC

## 2022-01-05 NOTE — ED Triage Notes (Signed)
Pt reports sore throat x3 days and earache on right side that began this morning.

## 2022-01-05 NOTE — Discharge Instructions (Addendum)
It was a pleasure taking care of you today!  For your sore throat you may consume hot tea with honey or cough drops to aid with your sore throat.  Ensure to maintain fluid intake.  For your right ear pain you will be sent a prescription for azithromycin, take as prescribed and make sure to complete the entire antibiotic.  You may follow-up with your primary care provider as needed.  Return to the ED if you are experiencing increasing/worsening fever, trouble swallowing, ear drainage, or worsening symptoms.

## 2022-01-05 NOTE — ED Provider Notes (Signed)
[facility]-EMERGENCY DEPT Provider Note   CSN: [id number] Arrival date & time: 01/05/22  6761     History  Chief Complaint  Patient presents with   Otalgia   Sore Throat    [name] is a 36 y.o. female who presents to the ED complaining of right ear pain onset this morning. She has associated sore throat.  She has not tried any medications for her symptoms. Denies sick contacts.  Denies shortness of breath, fever, chills, nausea, vomiting, ear drainage, trouble swallowing, abdominal pain.    The history is provided by the patient. No language interpreter was used.      Home Medications Prior to Admission medications   Medication Sig Start Date End Date Taking? Authorizing Provider  azithromycin (ZITHROMAX) 250 MG tablet Take 1 tablet (250 mg total) by mouth daily for 6 doses. Take first 2 tablets together, then 1 every day until finished. 01/05/22 01/11/22 Yes Maximino Cozzolino A, PA-C  cyclobenzaprine (FLEXERIL) 10 MG tablet Take 1 tablet (10 mg total) by mouth 2 (two) times daily as needed for muscle spasms. 06/03/14   Sciacca, Marissa, PA-C  ibuprofen (ADVIL,MOTRIN) 600 MG tablet Take 1 tablet (600 mg total) by mouth every 6 (six) hours as needed. 06/03/14   Sciacca, Marissa, PA-C  methocarbamol (ROBAXIN) 500 MG tablet Take 1 tablet (500 mg total) by mouth 2 (two) times daily. 10/27/16   Audry Pili, PA-C      Allergies    Penicillins    Review of Systems   Review of Systems  Constitutional:  Negative for chills and fever.  HENT:  Positive for ear pain and sore throat. Negative for ear discharge and trouble swallowing.   Respiratory:  Negative for shortness of breath.   Gastrointestinal:  Negative for nausea and vomiting.  All other systems reviewed and are negative.  Physical Exam Updated Vital Signs BP 119/81 (BP Location: Left Arm)    Pulse 86    Temp 98 F (36.7 C) (Oral)    Resp 15    SpO2 100%  Physical Exam Vitals and nursing note  reviewed.  Constitutional:      General: She is not in acute distress.    Appearance: She is not diaphoretic.  HENT:     Head: Normocephalic and atraumatic.     Right Ear: Ear canal and external ear normal. No drainage, swelling or tenderness. Tympanic membrane is erythematous. Tympanic membrane is not injected, scarred, perforated, retracted or bulging.     Left Ear: Ear canal and external ear normal. No drainage, swelling or tenderness. Tympanic membrane is not injected, scarred, perforated, erythematous, retracted or bulging.     Ears:     Comments: Bilateral canals and external ears without erythema.  No tenderness to palpation noted to bilateral external ears.  Erythema noted to right TM without bulging.    Mouth/Throat:     Pharynx: No oropharyngeal exudate.     Comments: Patent airway.  Uvula midline.  No signs of uvular swelling.  No evidence of posterior pharyngeal erythema or exudate.  No tonsillar exudate. Eyes:     General: No scleral icterus.    Conjunctiva/sclera: Conjunctivae normal.  Cardiovascular:     Rate and Rhythm: Normal rate and regular rhythm.     Pulses: Normal pulses.     Heart sounds: Normal heart sounds.  Pulmonary:     Effort: Pulmonary effort is normal. No respiratory distress.     Breath sounds: Normal breath sounds. No wheezing.  Abdominal:     General: Bowel sounds are normal.     Palpations: Abdomen is soft. There is no mass.     Tenderness: There is no abdominal tenderness. There is no guarding or rebound.  Musculoskeletal:        General: Normal range of motion.     Cervical back: Normal range of motion and neck supple.  Skin:    General: Skin is warm and dry.  Neurological:     Mental Status: She is alert.  Psychiatric:        Behavior: Behavior normal.    ED Results / Procedures / Treatments   Labs (all labs ordered are listed, but only abnormal results are displayed) Labs Reviewed - No data to display  EKG None  Radiology No  results found.  Procedures Procedures    Medications Ordered in ED Medications - No data to display  ED Course/ Medical Decision Making/ A&P                           Medical Decision Making Risk Prescription drug management.   Patient presents with right ear pain onset this morning.  Patient has associated sore throat. Denies sick contacts.  Vital signs stable, patient afebrile, not hypoxic or tachycardic.  On exam patient with erythema noted to the right TM without bulging.  Bilateral canals and external ears without erythema or tenderness to palpation.  Patent airway, no evidence of airway compromise.  No tonsillar exudate or abscess appreciated on exam. Differential diagnosis includes otitis media, otitis externa, acute mastoiditis, meningitis.    Patient presentation suspicious for otitis media.  Doubt otitis externa, acute mastoiditis, meningitis at this time.  Patient with the penicillin allergy on the chart.  Due to this, patient discharged home with azithromycin prescription.  Advised patient to follow-up with primary care provider.  Supportive care measures and strict return precautions discussed with patient at bedside.  Patient knowledges and verbalized understanding.  Patient for safe for discharge.  Follow-up with indicated discharge paperwork.     Final Clinical Impression(s) / ED Diagnoses Final diagnoses:  Acute otitis media, unspecified otitis media type  Upper respiratory tract infection, unspecified type    Rx / DC Orders ED Discharge Orders          Ordered    azithromycin (ZITHROMAX) 250 MG tablet  Daily        01/05/22 0830              Karma Ansley A, PA-C 01/05/22 1406    Derwood Kaplan, MD 01/06/22 [phone]

## 2023-07-31 ENCOUNTER — Emergency Department (HOSPITAL_BASED_OUTPATIENT_CLINIC_OR_DEPARTMENT_OTHER)
Admission: EM | Admit: 2023-07-31 | Discharge: 2023-07-31 | Disposition: A | Discharge status: Home / Self Care | Payer: 59 | Attending: Emergency Medicine | Admitting: Emergency Medicine

## 2023-07-31 ENCOUNTER — Emergency Department (HOSPITAL_BASED_OUTPATIENT_CLINIC_OR_DEPARTMENT_OTHER): Payer: 59

## 2023-07-31 ENCOUNTER — Encounter (HOSPITAL_BASED_OUTPATIENT_CLINIC_OR_DEPARTMENT_OTHER): Payer: Self-pay | Admitting: Emergency Medicine

## 2023-07-31 DIAGNOSIS — M79644 Pain in right finger(s): Secondary | ICD-10-CM | POA: Insufficient documentation

## 2023-07-31 MED ORDER — IBUPROFEN 800 MG PO TABS
800.0000 mg | ORAL_TABLET | Freq: Once | ORAL | Status: AC
  Administered 2023-07-31: 800 mg via ORAL
  Filled 2023-07-31: qty 1
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
## 2023-07-31 NOTE — Discharge Instructions (Addendum)
Please take tylenol/ibuprofen for pain.  Use cold compresses 3 times a day for symptom relief. I recommend close follow-up with PCP or orthopedics for reevaluation.  Please do not hesitate to return to emergency department if worrisome signs symptoms we discussed become apparent.
